# Patient Record
Sex: Female | Born: 1995 | Race: White | Hispanic: No | Marital: Single
Health system: Southern US, Community
[De-identification: ages and names within clinical notes are randomized; demographics above are authoritative.]

---
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
## 2017-03-23 ENCOUNTER — Ambulatory Visit: Payer: BC Managed Care – PPO | Admitting: Neurology

## 2017-04-09 ENCOUNTER — Ambulatory Visit: Payer: BC Managed Care – PPO | Admitting: Neurology

## 2017-12-08 IMAGING — CR DG KNEE COMPLETE 4+V*R*
1 series · 4 of 4 positions shown · non-contrast
Comparison: None.

CLINICAL DATA: Fell yesterday during margin band practice on to
gravel, landing on the right knee. Right knee pain and some
swelling. Initial encounter.

EXAM:
RIGHT KNEE - COMPLETE 4+ VIEW

[Series 1: dg knee complete 4 views right · 0.14mm/px · 4 of 4 slices shown]
[im 1/4]
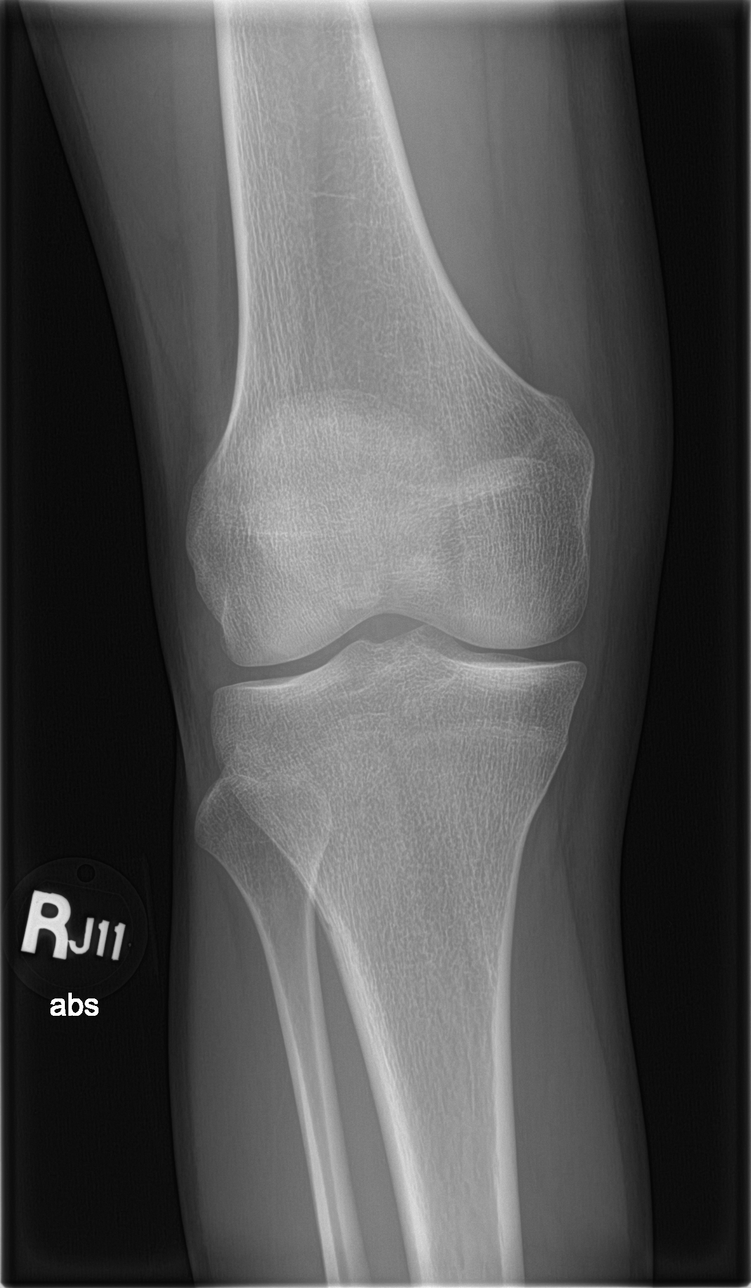
[im 2/4]
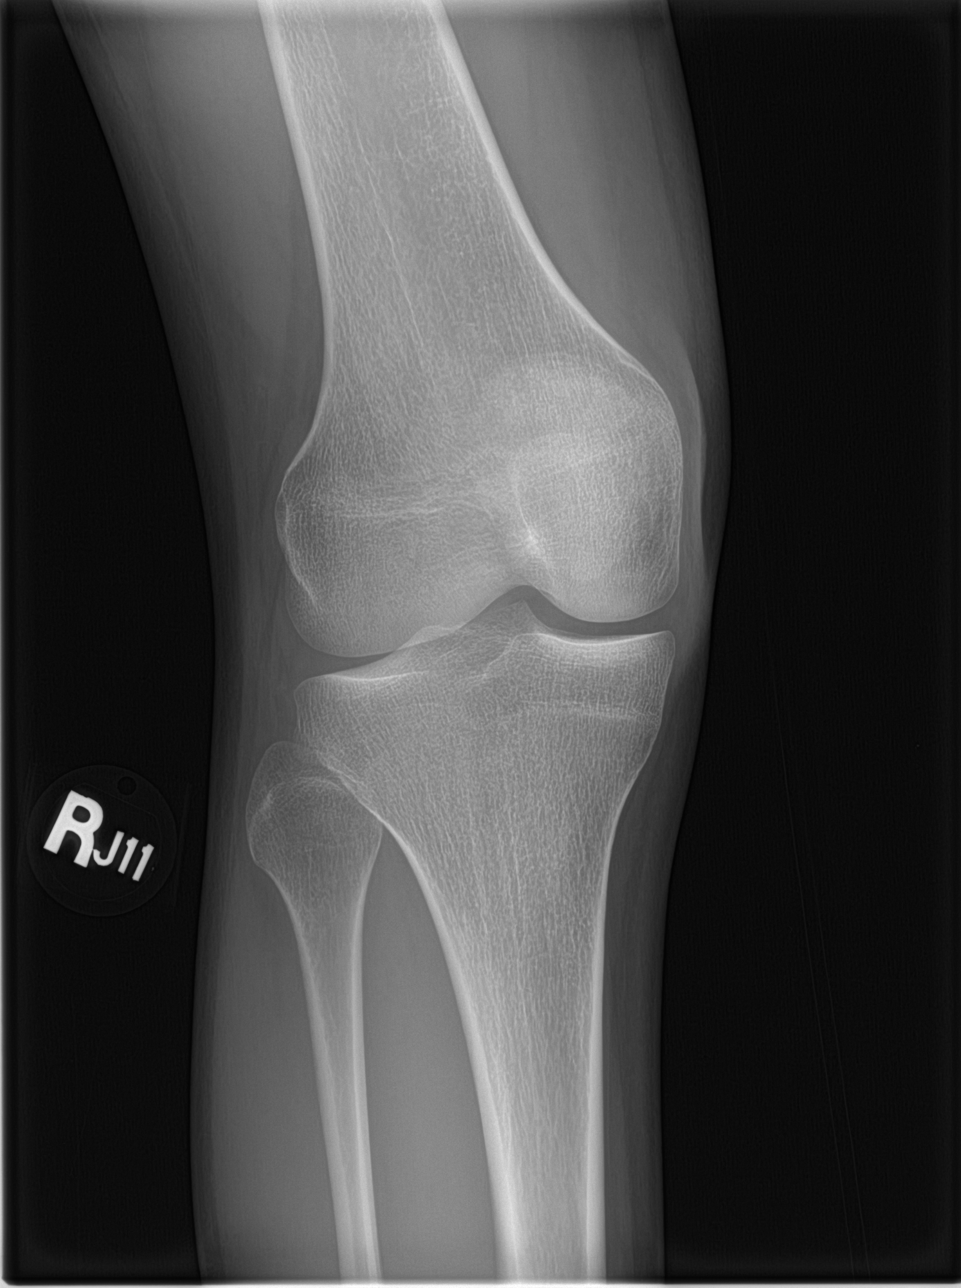
[im 3/4]
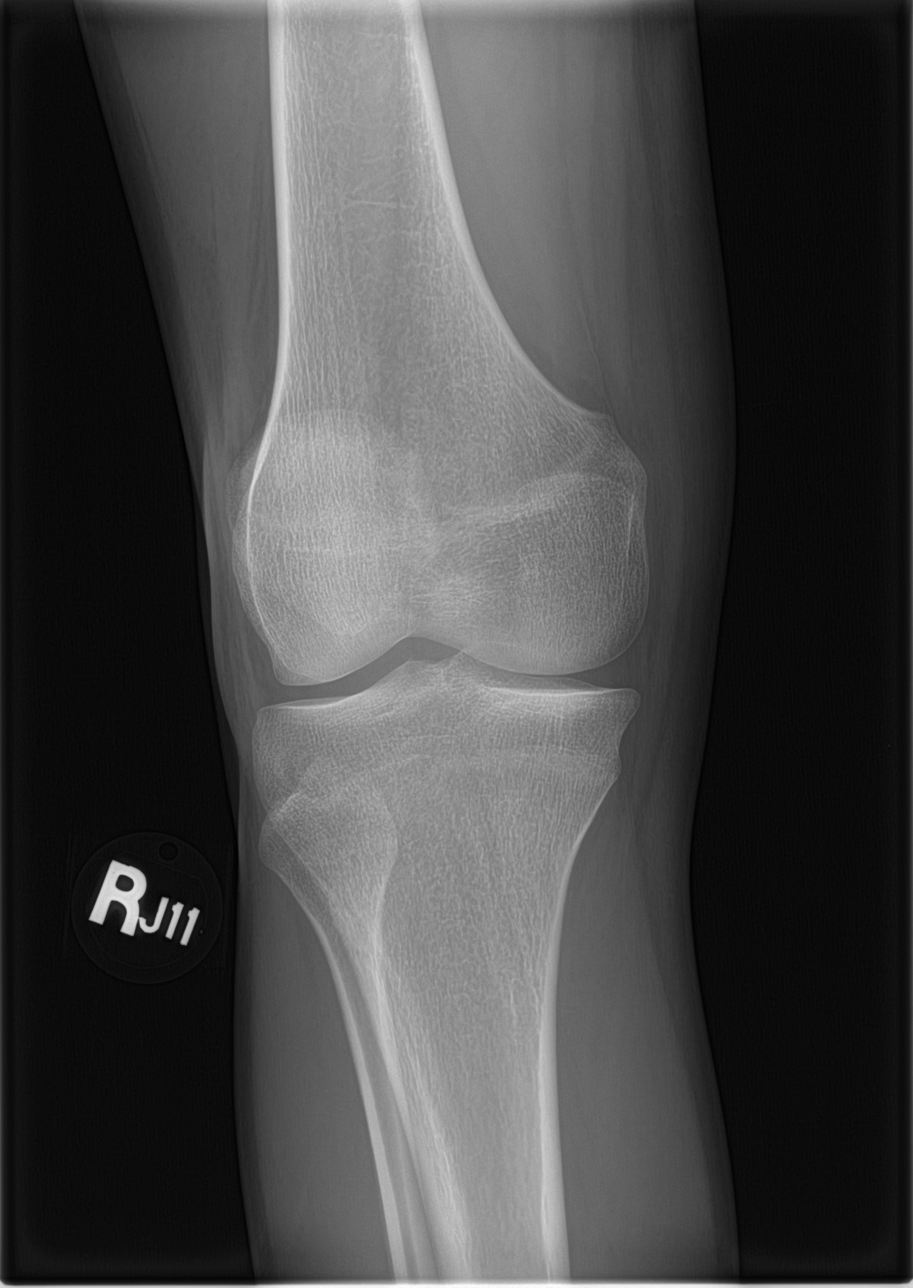
[im 4/4]
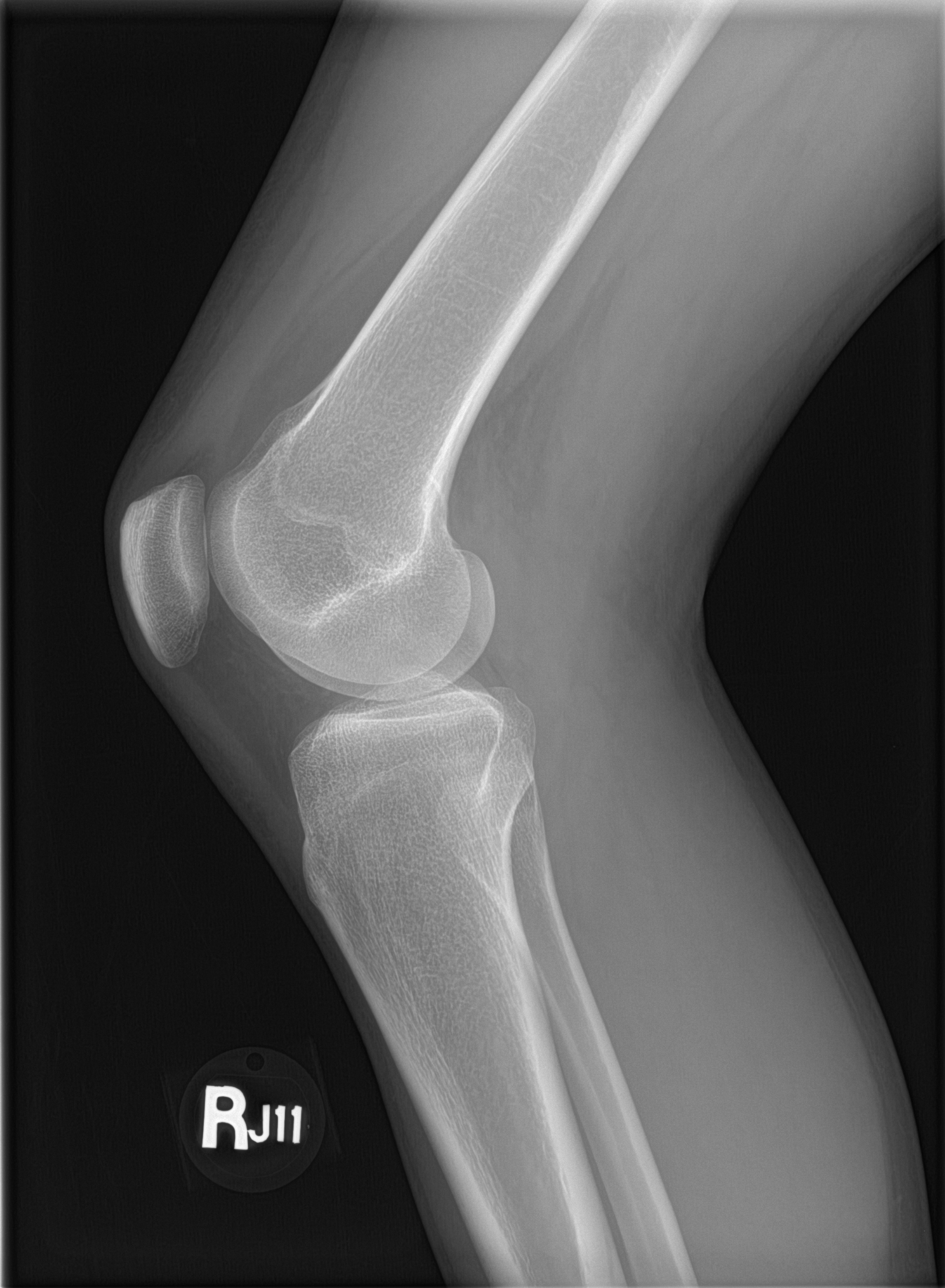

[4 of 4 positions shown; findings below may reference images not displayed]

FINDINGS: There is no evidence of fracture, dislocation, or joint effusion.
There is no evidence of arthropathy or other focal bone abnormality.
Soft tissues are unremarkable.
IMPRESSION: Negative.

## 2019-11-22 ENCOUNTER — Ambulatory Visit: Payer: BC Managed Care – PPO | Attending: Neurology

## 2019-11-22 DIAGNOSIS — G2581 Restless legs syndrome: Secondary | ICD-10-CM | POA: Insufficient documentation

## 2019-11-22 DIAGNOSIS — G478 Other sleep disorders: Secondary | ICD-10-CM | POA: Insufficient documentation

## 2019-11-23 ENCOUNTER — Other Ambulatory Visit: Payer: Self-pay

## 2022-12-26 ENCOUNTER — Other Ambulatory Visit: Payer: Self-pay

## 2022-12-29 NOTE — Progress Notes (Signed)
Completed pre-employment UDS. HR notified.
